# Patient Record
Sex: Male | Born: 1955 | Race: Asian | Hispanic: No | Marital: Married
Health system: Southern US, Community
[De-identification: ages and names within clinical notes are randomized; demographics above are authoritative.]

---

## 1999-03-30 ENCOUNTER — Ambulatory Visit (HOSPITAL_COMMUNITY): Admission: RE | Admit: 1999-03-30 | Discharge: 1999-03-30 | Payer: Self-pay | Admitting: Family Medicine

## 1999-03-30 ENCOUNTER — Encounter: Payer: Self-pay | Admitting: Family Medicine

## 1999-09-11 ENCOUNTER — Encounter: Payer: Self-pay | Admitting: Emergency Medicine

## 1999-09-11 ENCOUNTER — Emergency Department (HOSPITAL_COMMUNITY): Admission: EM | Admit: 1999-09-11 | Discharge: 1999-09-11 | Payer: Self-pay | Admitting: Emergency Medicine

## 1999-11-05 ENCOUNTER — Ambulatory Visit (HOSPITAL_COMMUNITY): Admission: RE | Admit: 1999-11-05 | Discharge: 1999-11-05 | Payer: Self-pay | Admitting: Orthopedic Surgery

## 1999-11-05 ENCOUNTER — Encounter: Payer: Self-pay | Admitting: Orthopedic Surgery

## 2007-01-28 ENCOUNTER — Emergency Department: Payer: Self-pay | Admitting: Unknown Physician Specialty

## 2007-01-28 ENCOUNTER — Other Ambulatory Visit: Payer: Self-pay

## 2008-11-04 IMAGING — CT CT STONE STUDY
1 of 2 series · 16 of 32 positions shown, 20 images · non-contrast
Comparison: none

REASON FOR EXAM: left flank pain
COMMENTS:

[Series 2: stone · axial · 0.70mm/px · z∈[-150,+318]mm · 16 of 176 slices shown, 20 images]
[im 13/176  soft-tissue]
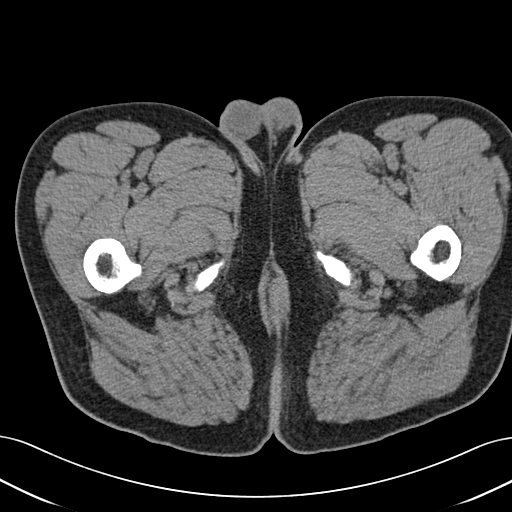
[im 13/176  bone]
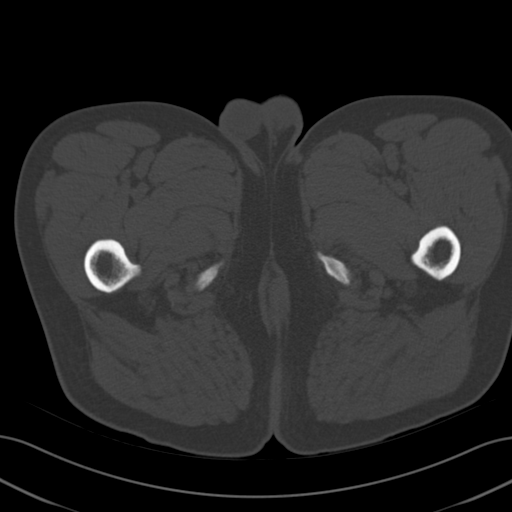
[im 25/176  soft-tissue]
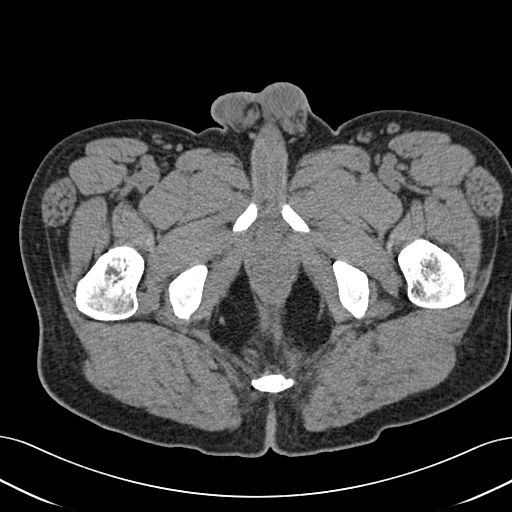
[im 37/176  soft-tissue]
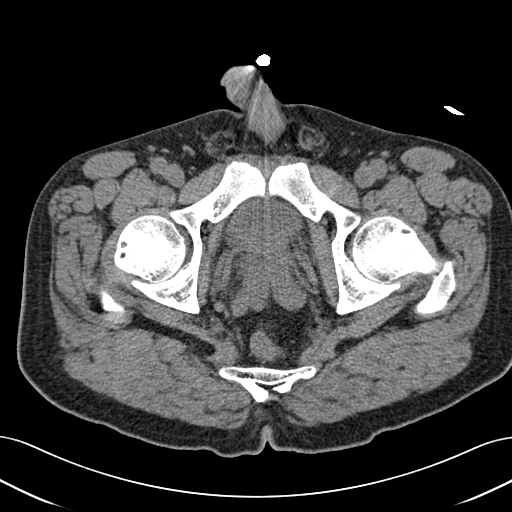
[im 49/176  soft-tissue]
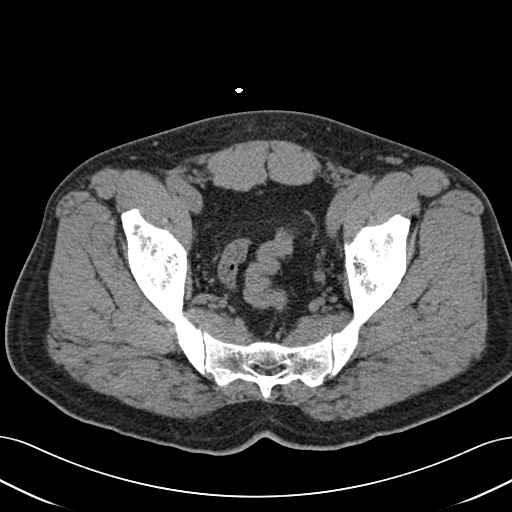
[im 61/176  soft-tissue]
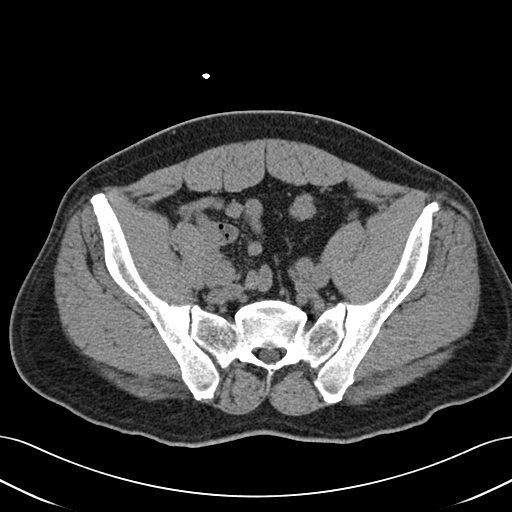
[im 73/176  soft-tissue]
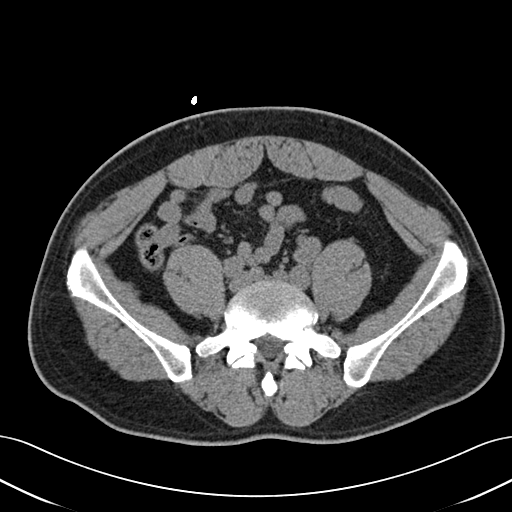
[im 85/176  soft-tissue]
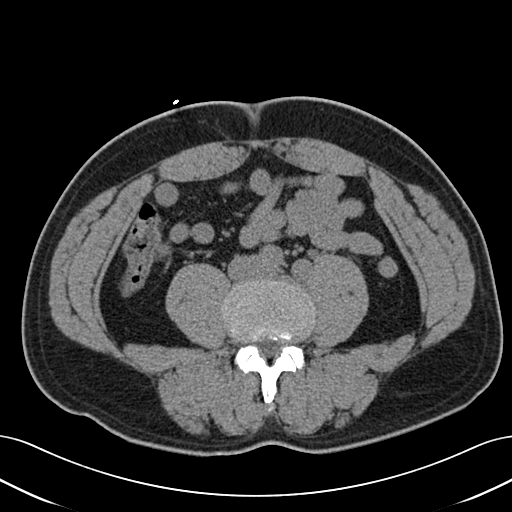
[im 97/176  soft-tissue]
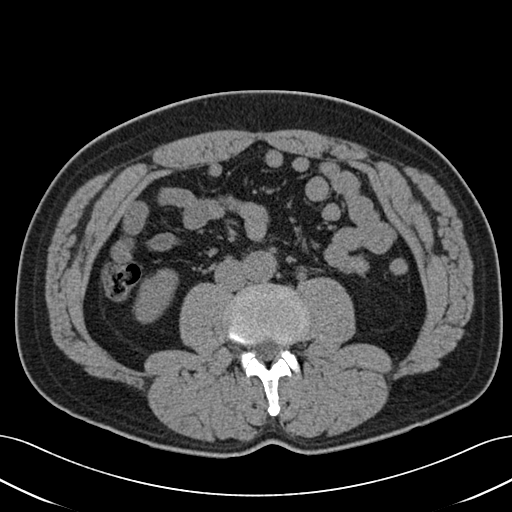
[im 109/176  soft-tissue]
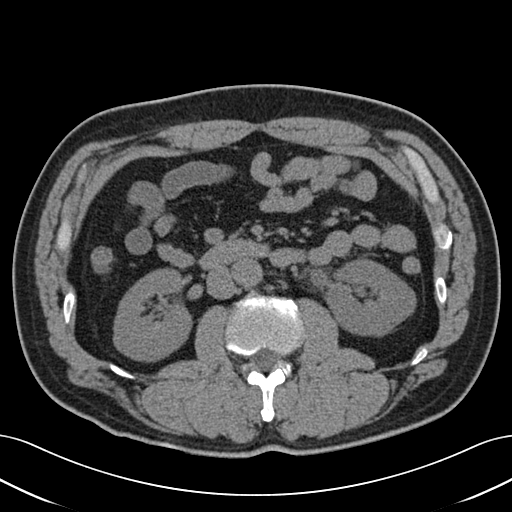
[im 109/176  bone]
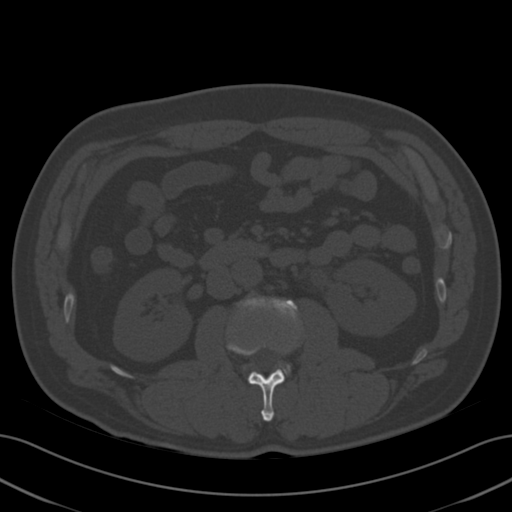
[im 121/176  soft-tissue]
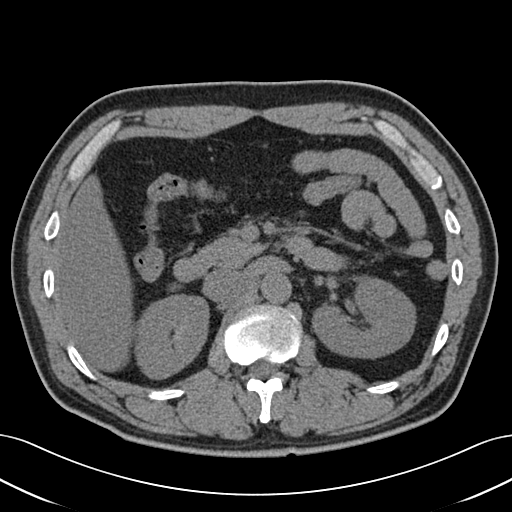
[im 133/176  soft-tissue]
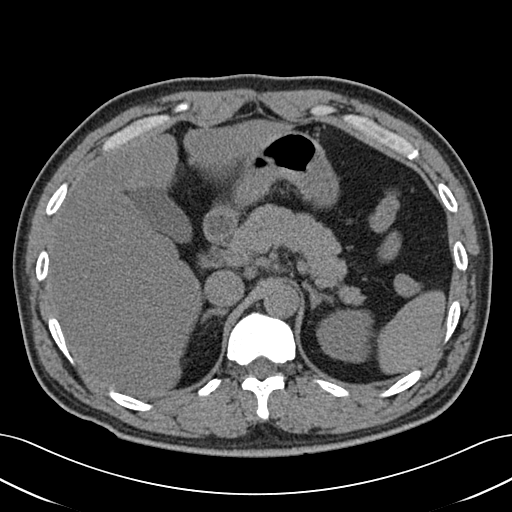
[im 145/176  soft-tissue]
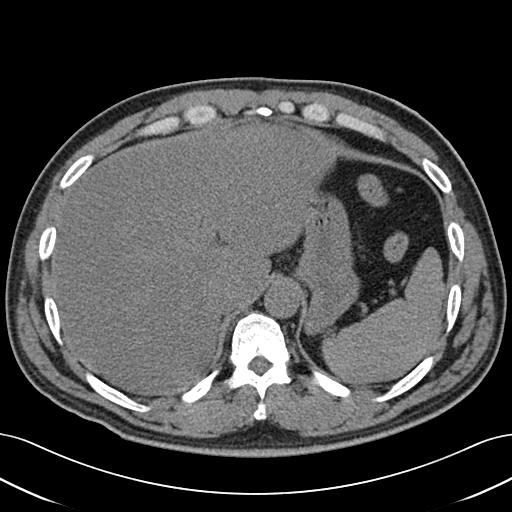
[im 151/176  lung]
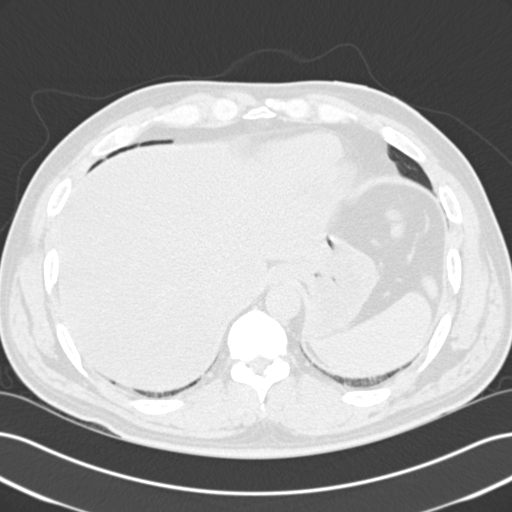
[im 157/176  soft-tissue]
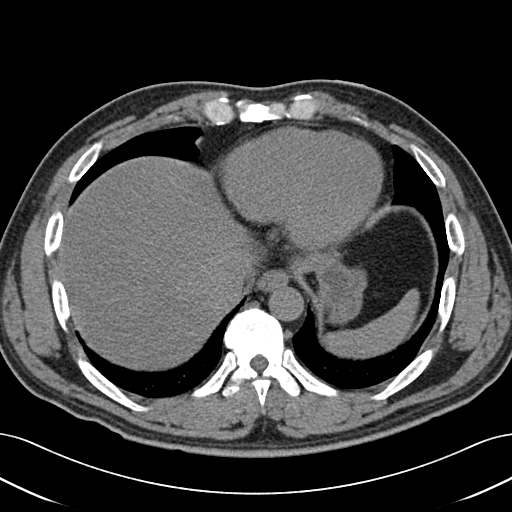
[im 157/176  lung]
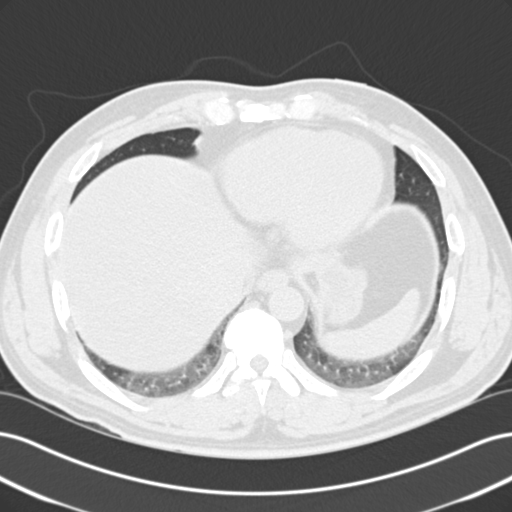
[im 163/176  lung]
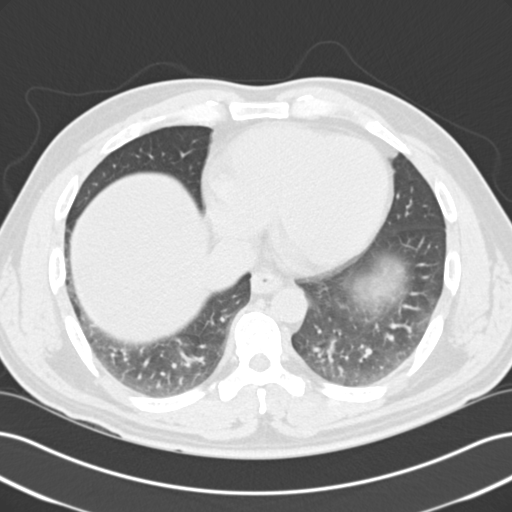
[im 169/176  soft-tissue]
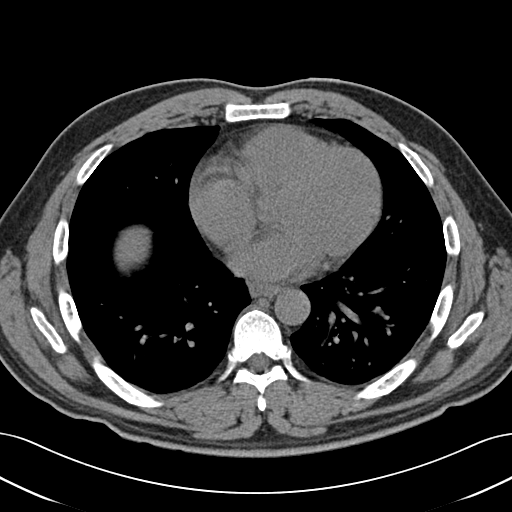
[im 169/176  lung]
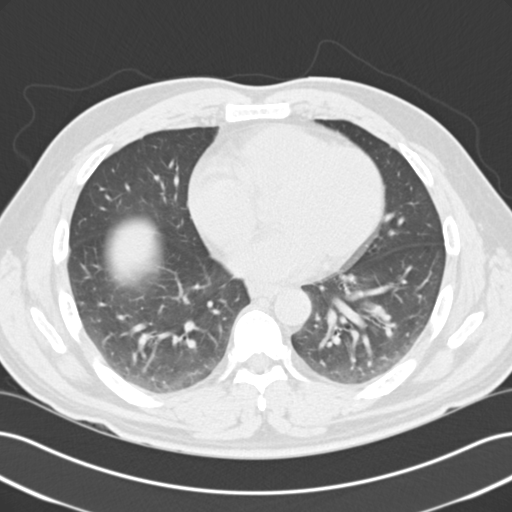

[16 of 32 positions shown; findings below may reference images not displayed]

PROCEDURE:     CT  - CT ABDOMEN /PELVIS WO (STONE)  - January 28, 2007  [DATE]

RESULT:     Noncontrast emergent CT of the abdomen and pelvis demonstrates
mild hydroureter and hydronephrosis on the left with a roughly 3 mm stone on
image 137 at the left ureterovesical junction. The bowel is unremarkable.
Atherosclerotic calcification is present in the aorta and iliac arteries. No
additional calculi are evident. The upper abdominal viscera appear to be
normal for a noncontrast exam. The lung bases demonstrate minimal dependent
atelectasis and are otherwise normal.
IMPRESSION: 1. Left ureterovesical junction stone with mild hydroureter and
hydronephrosis.

## 2015-01-14 ENCOUNTER — Encounter: Payer: Self-pay | Admitting: *Deleted

## 2015-01-14 DIAGNOSIS — Z23 Encounter for immunization: Secondary | ICD-10-CM

## 2015-01-20 NOTE — Congregational Nurse Program (Unsigned)
Congregational Nurse Program Note  Date of Encounter: 01/13/2015  Past Medical History: No past medical history on file.  Encounter Details:  Flu shot given

## 2015-04-11 ENCOUNTER — Telehealth: Payer: Self-pay | Admitting: *Deleted

## 2015-04-11 ENCOUNTER — Encounter: Payer: Self-pay | Admitting: *Deleted

## 2015-04-11 DIAGNOSIS — R1032 Left lower quadrant pain: Secondary | ICD-10-CM

## 2015-04-11 NOTE — Telephone Encounter (Signed)
referral [facility] to check up due to pt said almost same pain when he had inguinal hernia. Will F/u

## 2015-04-11 NOTE — Congregational Nurse Program (Unsigned)
Congregational Nurse Program Note  Date of Encounter: 03/20/2015  Past Medical History: No past medical history on file.  Encounter Details:  pt questioned medicines what MD ordered. Flomaxin 0.4mg  QD Ibuprofen 800mg  q8hrs prn per pain Pt started taking med 2days ago, no pain. Explained regarding medicine . Pt. Understood and will call me if any concerns.

## 2015-04-11 NOTE — Congregational Nurse Program (Signed)
Congregational Nurse Program Note  Date of Encounter: 03/06/2015  Past Medical History: Kidney stone Inguinal hernia-s/p herniorrhapy  Encounter Details: C/o groin pain. Referral MD.-for [facility] surgery  Pt called,the surgery center required  referral from primary MD. Will f/u.

## 2015-04-11 NOTE — Congregational Nurse Program (Signed)
Congregational Nurse Program Note  Date of Encounter: 04/11/2015  Past Medical History: No past medical history on file.  Encounter Details:     CNP Questionnaire - 03/15/15 1100    Patient Demographics   Is this a new or existing patient? Existing   Patient is considered a/an Immigrant   Race Asian   Patient Assistance   Location of Patient Assistance Not Applicable   Patient's financial/insurance status Affordable Care Act   Uninsured Patient No   Patient referred to apply for the following financial assistance Not Applicable   Food insecurities addressed Not Applicable   Transportation assistance No   Assistance securing medications No   Educational health offerings Not Applicable   Encounter Details   Primary purpose of visit Post PCP Visit;Spiritual Care/Support Visit;Education/Health Concerns   Was an Emergency Department visit averted? Not Applicable   Does patient have a medical provider? No   Patient referred to Other (comment)  pt visited urgent care  stated ultrasonogram done at urgent care , will surgeon f/u result   Was a mental health screening completed? (GAINS tool) No   Does patient have dental issues? No   Since previous encounter, have you referred patient for abnormal blood pressure that resulted in a new diagnosis or medication change? No   Since previous encounter, have you referred patient for abnormal blood glucose that resulted in a new diagnosis or medication change? No      Will f/u after visit MD
